# Patient Record
Sex: Male | Born: 1965 | Race: Black or African American | Hispanic: No | Marital: Single | Smoking: Current some day smoker
Health system: Southern US, Community
[De-identification: ages and names within clinical notes are randomized; demographics above are authoritative.]

## PROBLEM LIST (undated history)

## (undated) DIAGNOSIS — E559 Vitamin D deficiency, unspecified: Secondary | ICD-10-CM

## (undated) DIAGNOSIS — F17200 Nicotine dependence, unspecified, uncomplicated: Secondary | ICD-10-CM

## (undated) DIAGNOSIS — S32811A Multiple fractures of pelvis with unstable disruption of pelvic ring, initial encounter for closed fracture: Secondary | ICD-10-CM

## (undated) DIAGNOSIS — S8252XD Displaced fracture of medial malleolus of left tibia, subsequent encounter for closed fracture with routine healing: Secondary | ICD-10-CM

## (undated) HISTORY — PX: ORIF PELVIC FRACTURE: SHX2128

---
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
## 2019-06-06 ENCOUNTER — Inpatient Hospital Stay (HOSPITAL_COMMUNITY)
Admission: EM | Admit: 2019-06-06 | Discharge: 2019-06-12 | DRG: 493 | Disposition: A | Discharge status: Home Health | Payer: No Typology Code available for payment source | Attending: General Surgery | Admitting: General Surgery

## 2019-06-06 ENCOUNTER — Encounter (HOSPITAL_COMMUNITY): Payer: Self-pay

## 2019-06-06 ENCOUNTER — Emergency Department (HOSPITAL_COMMUNITY): Payer: No Typology Code available for payment source

## 2019-06-06 ENCOUNTER — Encounter (HOSPITAL_COMMUNITY): Admission: EM | Disposition: A | Discharge status: Home Health | Payer: Self-pay | Source: Home / Self Care

## 2019-06-06 DIAGNOSIS — S8252XA Displaced fracture of medial malleolus of left tibia, initial encounter for closed fracture: Secondary | ICD-10-CM | POA: Diagnosis present

## 2019-06-06 DIAGNOSIS — T148XXA Other injury of unspecified body region, initial encounter: Secondary | ICD-10-CM

## 2019-06-06 DIAGNOSIS — D62 Acute posthemorrhagic anemia: Secondary | ICD-10-CM | POA: Diagnosis not present

## 2019-06-06 DIAGNOSIS — F17210 Nicotine dependence, cigarettes, uncomplicated: Secondary | ICD-10-CM | POA: Diagnosis present

## 2019-06-06 DIAGNOSIS — S32810A Multiple fractures of pelvis with stable disruption of pelvic ring, initial encounter for closed fracture: Secondary | ICD-10-CM | POA: Diagnosis present

## 2019-06-06 DIAGNOSIS — E860 Dehydration: Secondary | ICD-10-CM | POA: Diagnosis present

## 2019-06-06 DIAGNOSIS — S8252XD Displaced fracture of medial malleolus of left tibia, subsequent encounter for closed fracture with routine healing: Secondary | ICD-10-CM

## 2019-06-06 DIAGNOSIS — Z419 Encounter for procedure for purposes other than remedying health state, unspecified: Secondary | ICD-10-CM

## 2019-06-06 DIAGNOSIS — N5089 Other specified disorders of the male genital organs: Secondary | ICD-10-CM | POA: Diagnosis not present

## 2019-06-06 DIAGNOSIS — Z1159 Encounter for screening for other viral diseases: Secondary | ICD-10-CM | POA: Diagnosis not present

## 2019-06-06 DIAGNOSIS — E559 Vitamin D deficiency, unspecified: Secondary | ICD-10-CM | POA: Diagnosis present

## 2019-06-06 DIAGNOSIS — S32811A Multiple fractures of pelvis with unstable disruption of pelvic ring, initial encounter for closed fracture: Secondary | ICD-10-CM | POA: Diagnosis present

## 2019-06-06 HISTORY — DX: Nicotine dependence, unspecified, uncomplicated: F17.200

## 2019-06-06 HISTORY — DX: Multiple fractures of pelvis with unstable disruption of pelvic ring, initial encounter for closed fracture: S32.811A

## 2019-06-06 LAB — I-STAT CHEM 8, ED
BUN: 18 mg/dL (ref 6–20)
Calcium, Ion: 1.05 mmol/L — ABNORMAL LOW (ref 1.15–1.40)
Chloride: 105 mmol/L (ref 98–111)
Creatinine, Ser: 1.7 mg/dL — ABNORMAL HIGH (ref 0.61–1.24)
Glucose, Bld: 109 mg/dL — ABNORMAL HIGH (ref 70–99)
HCT: 49 % (ref 39.0–52.0)
Hemoglobin: 16.7 g/dL (ref 13.0–17.0)
Potassium: 3.8 mmol/L (ref 3.5–5.1)
Sodium: 140 mmol/L (ref 135–145)
TCO2: 27 mmol/L (ref 22–32)

## 2019-06-06 LAB — COMPREHENSIVE METABOLIC PANEL
ALT: 45 U/L — ABNORMAL HIGH (ref 0–44)
AST: 41 U/L (ref 15–41)
Albumin: 3.8 g/dL (ref 3.5–5.0)
Alkaline Phosphatase: 89 U/L (ref 38–126)
Anion gap: 14 (ref 5–15)
BUN: 15 mg/dL (ref 6–20)
CO2: 22 mmol/L (ref 22–32)
Calcium: 8.9 mg/dL (ref 8.9–10.3)
Chloride: 104 mmol/L (ref 98–111)
Creatinine, Ser: 1.48 mg/dL — ABNORMAL HIGH (ref 0.61–1.24)
GFR calc Af Amer: >60 mL/min (ref >60)
GFR calc non Af Amer: 53 mL/min — ABNORMAL LOW (ref >60)
Glucose, Bld: 112 mg/dL — ABNORMAL HIGH (ref 70–99)
Potassium: 4 mmol/L (ref 3.5–5.1)
Sodium: 140 mmol/L (ref 135–145)
Total Bilirubin: 0.8 mg/dL (ref 0.3–1.2)
Total Protein: 7.1 g/dL (ref 6.5–8.1)

## 2019-06-06 LAB — SAMPLE TO BLOOD BANK

## 2019-06-06 LAB — CBC
HCT: 46.5 % (ref 39.0–52.0)
Hemoglobin: 15.4 g/dL (ref 13.0–17.0)
MCH: 32 pg (ref 26.0–34.0)
MCHC: 33.1 g/dL (ref 30.0–36.0)
MCV: 96.7 fL (ref 80.0–100.0)
Platelets: 206 10*3/uL (ref 150–400)
RBC: 4.81 MIL/uL (ref 4.22–5.81)
RDW: 14.3 % (ref 11.5–15.5)
WBC: 8.8 10*3/uL (ref 4.0–10.5)
nRBC: 0.3 % — ABNORMAL HIGH (ref 0.0–0.2)

## 2019-06-06 LAB — SARS CORONAVIRUS 2 BY RT PCR (HOSPITAL ORDER, PERFORMED IN ~~LOC~~ HOSPITAL LAB): SARS Coronavirus 2: NEGATIVE

## 2019-06-06 LAB — CDS SEROLOGY

## 2019-06-06 LAB — LACTIC ACID, PLASMA: Lactic Acid, Venous: 4.2 mmol/L (ref 0.5–1.9)

## 2019-06-06 LAB — PROTIME-INR
INR: 0.9 (ref 0.8–1.2)
Prothrombin Time: 12.2 seconds (ref 11.4–15.2)

## 2019-06-06 LAB — ETHANOL: Alcohol, Ethyl (B): 83 mg/dL — ABNORMAL HIGH (ref <10)

## 2019-06-06 SURGERY — OPEN REDUCTION INTERNAL FIXATION (ORIF) ANKLE FRACTURE
Anesthesia: General

## 2019-06-06 MED ORDER — HYDROMORPHONE HCL 1 MG/ML IJ SOLN
0.5000 mg | INTRAMUSCULAR | Status: DC | PRN
  Administered 2019-06-07: 0.5 mg via INTRAVENOUS
  Filled 2019-06-06: qty 1

## 2019-06-06 MED ORDER — IOHEXOL 300 MG/ML  SOLN
100.0000 mL | Freq: Once | INTRAMUSCULAR | Status: AC | PRN
  Administered 2019-06-06: 100 mL via INTRAVENOUS

## 2019-06-06 MED ORDER — METOPROLOL TARTRATE 5 MG/5ML IV SOLN: 5.0000 mg | Freq: Four times a day (QID) | INTRAVENOUS | Status: DC | PRN

## 2019-06-06 MED ORDER — OXYCODONE HCL 5 MG PO TABS: 5.0000 mg | ORAL_TABLET | ORAL | Status: DC | PRN

## 2019-06-06 MED ORDER — METHOCARBAMOL 500 MG PO TABS: 500.0000 mg | ORAL_TABLET | Freq: Four times a day (QID) | ORAL | Status: DC | PRN

## 2019-06-06 MED ORDER — HYDRALAZINE HCL 20 MG/ML IJ SOLN: 10.0000 mg | INTRAMUSCULAR | Status: DC | PRN

## 2019-06-06 MED ORDER — ENOXAPARIN SODIUM 40 MG/0.4ML ~~LOC~~ SOLN
40.0000 mg | Freq: Every day | SUBCUTANEOUS | Status: DC
  Administered 2019-06-08 – 2019-06-12 (×5): 40 mg via SUBCUTANEOUS
  Filled 2019-06-06 (×5): qty 0.4

## 2019-06-06 MED ORDER — ONDANSETRON HCL 4 MG/2ML IJ SOLN: 4.0000 mg | Freq: Four times a day (QID) | INTRAMUSCULAR | Status: DC | PRN

## 2019-06-06 MED ORDER — SODIUM CHLORIDE 0.9 % IV SOLN
INTRAVENOUS | Status: DC
  Administered 2019-06-07 (×2): via INTRAVENOUS

## 2019-06-06 MED ORDER — GABAPENTIN 300 MG PO CAPS
300.0000 mg | ORAL_CAPSULE | Freq: Three times a day (TID) | ORAL | Status: DC
  Administered 2019-06-07 – 2019-06-08 (×7): 300 mg via ORAL
  Filled 2019-06-06 (×8): qty 1

## 2019-06-06 MED ORDER — ONDANSETRON 4 MG PO TBDP: 4.0000 mg | ORAL_TABLET | Freq: Four times a day (QID) | ORAL | Status: DC | PRN

## 2019-06-06 MED ORDER — ACETAMINOPHEN 325 MG PO TABS
650.0000 mg | ORAL_TABLET | Freq: Four times a day (QID) | ORAL | Status: DC
  Administered 2019-06-07: 08:00:00 650 mg via ORAL
  Filled 2019-06-06 (×2): qty 2

## 2019-06-06 MED ORDER — DOCUSATE SODIUM 100 MG PO CAPS
100.0000 mg | ORAL_CAPSULE | Freq: Two times a day (BID) | ORAL | Status: DC
  Administered 2019-06-07 – 2019-06-12 (×7): 100 mg via ORAL
  Filled 2019-06-06 (×12): qty 1

## 2019-06-06 MED ORDER — FENTANYL CITRATE (PF) 100 MCG/2ML IJ SOLN
100.0000 ug | Freq: Once | INTRAMUSCULAR | Status: AC
  Administered 2019-06-06: 100 ug via INTRAVENOUS
  Filled 2019-06-06: qty 2
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
## 2019-06-06 NOTE — Progress Notes (Signed)
Orthopedic Tech Progress Note Patient Details:  Joseph Fernandez [birth year] [id number]  Ortho Devices Type of Ortho Device: Post (short leg) splint, Stirrup splint Ortho Device/Splint Location: lle. applied at drs request. Ortho Device/Splint Interventions: Ordered, Application, Adjustment   Post Interventions Patient Tolerated: Well Instructions Provided: Care of device, Adjustment of device   Karolee Stamps 06/06/2019, 10:19 PM

## 2019-06-06 NOTE — ED Notes (Signed)
Dr. Ralene Bathe notified of Lactic Acid 4.2
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
## 2019-06-07 ENCOUNTER — Inpatient Hospital Stay (HOSPITAL_COMMUNITY): Payer: No Typology Code available for payment source

## 2019-06-07 ENCOUNTER — Encounter (HOSPITAL_COMMUNITY): Admission: EM | Disposition: A | Discharge status: Home Health | Payer: Self-pay | Source: Home / Self Care

## 2019-06-07 ENCOUNTER — Encounter (HOSPITAL_COMMUNITY): Payer: Self-pay | Admitting: *Deleted

## 2019-06-07 ENCOUNTER — Inpatient Hospital Stay (HOSPITAL_COMMUNITY): Payer: No Typology Code available for payment source | Admitting: Anesthesiology

## 2019-06-07 ENCOUNTER — Other Ambulatory Visit: Payer: Self-pay

## 2019-06-07 HISTORY — PX: ORIF PELVIC FRACTURE: SHX2128

## 2019-06-07 HISTORY — PX: ORIF ANKLE FRACTURE: SHX5408

## 2019-06-07 LAB — CBC
HCT: 39.4 % (ref 39.0–52.0)
Hemoglobin: 13.5 g/dL (ref 13.0–17.0)
MCH: 32.1 pg (ref 26.0–34.0)
MCHC: 34.3 g/dL (ref 30.0–36.0)
MCV: 93.6 fL (ref 80.0–100.0)
Platelets: 184 10*3/uL (ref 150–400)
RBC: 4.21 MIL/uL — ABNORMAL LOW (ref 4.22–5.81)
RDW: 14.2 % (ref 11.5–15.5)
WBC: 10.5 10*3/uL (ref 4.0–10.5)
nRBC: 0 % (ref 0.0–0.2)

## 2019-06-07 LAB — URINALYSIS, ROUTINE W REFLEX MICROSCOPIC
Bacteria, UA: NONE SEEN
Bilirubin Urine: NEGATIVE
Glucose, UA: NEGATIVE mg/dL
Ketones, ur: 5 mg/dL — AB
Leukocytes,Ua: NEGATIVE
Nitrite: NEGATIVE
Protein, ur: NEGATIVE mg/dL
Specific Gravity, Urine: 1.039 — ABNORMAL HIGH (ref 1.005–1.030)
pH: 5 (ref 5.0–8.0)

## 2019-06-07 LAB — BASIC METABOLIC PANEL
Anion gap: 7 (ref 5–15)
BUN: 12 mg/dL (ref 6–20)
CO2: 26 mmol/L (ref 22–32)
Calcium: 8.5 mg/dL — ABNORMAL LOW (ref 8.9–10.3)
Chloride: 105 mmol/L (ref 98–111)
Creatinine, Ser: 1.12 mg/dL (ref 0.61–1.24)
GFR calc Af Amer: >60 mL/min (ref >60)
GFR calc non Af Amer: >60 mL/min (ref >60)
Glucose, Bld: 123 mg/dL — ABNORMAL HIGH (ref 70–99)
Potassium: 4.1 mmol/L (ref 3.5–5.1)
Sodium: 138 mmol/L (ref 135–145)

## 2019-06-07 LAB — SURGICAL PCR SCREEN
MRSA, PCR: NEGATIVE
Staphylococcus aureus: NEGATIVE

## 2019-06-07 LAB — HIV ANTIBODY (ROUTINE TESTING W REFLEX): HIV Screen 4th Generation wRfx: NONREACTIVE

## 2019-06-07 LAB — LACTIC ACID, PLASMA: Lactic Acid, Venous: 1.2 mmol/L (ref 0.5–1.9)

## 2019-06-07 SURGERY — OPEN REDUCTION INTERNAL FIXATION (ORIF) PELVIC FRACTURE
Anesthesia: General | Site: Pelvis

## 2019-06-07 MED ORDER — MIDAZOLAM HCL 2 MG/2ML IJ SOLN
INTRAMUSCULAR | Status: AC
  Filled 2019-06-07: qty 4

## 2019-06-07 MED ORDER — DEXAMETHASONE SODIUM PHOSPHATE 10 MG/ML IJ SOLN
INTRAMUSCULAR | Status: DC | PRN
  Administered 2019-06-07: 10 mg via INTRAVENOUS

## 2019-06-07 MED ORDER — ONDANSETRON HCL 4 MG/2ML IJ SOLN: 4.0000 mg | Freq: Once | INTRAMUSCULAR | Status: DC | PRN

## 2019-06-07 MED ORDER — ROCURONIUM BROMIDE 10 MG/ML (PF) SYRINGE
PREFILLED_SYRINGE | INTRAVENOUS | Status: AC
  Filled 2019-06-07: qty 20

## 2019-06-07 MED ORDER — LIDOCAINE 2% (20 MG/ML) 5 ML SYRINGE
INTRAMUSCULAR | Status: AC
  Filled 2019-06-07: qty 5

## 2019-06-07 MED ORDER — ONDANSETRON HCL 4 MG/2ML IJ SOLN
INTRAMUSCULAR | Status: DC | PRN
  Administered 2019-06-07: 4 mg via INTRAVENOUS

## 2019-06-07 MED ORDER — OXYCODONE HCL 5 MG PO TABS: 5.0000 mg | ORAL_TABLET | Freq: Once | ORAL | Status: DC | PRN

## 2019-06-07 MED ORDER — CHLORHEXIDINE GLUCONATE 4 % EX LIQD: 60.0000 mL | Freq: Once | CUTANEOUS | Status: DC

## 2019-06-07 MED ORDER — ROCURONIUM BROMIDE 10 MG/ML (PF) SYRINGE
PREFILLED_SYRINGE | INTRAVENOUS | Status: DC | PRN
  Administered 2019-06-07: 50 mg via INTRAVENOUS
  Administered 2019-06-07: 30 mg via INTRAVENOUS
  Administered 2019-06-07 (×2): 20 mg via INTRAVENOUS

## 2019-06-07 MED ORDER — HYDROMORPHONE HCL 1 MG/ML IJ SOLN
0.5000 mg | Freq: Once | INTRAMUSCULAR | Status: AC
  Administered 2019-06-07: 0.5 mg via INTRAVENOUS
  Filled 2019-06-07: qty 1

## 2019-06-07 MED ORDER — OXYCODONE HCL 5 MG PO TABS
5.0000 mg | ORAL_TABLET | ORAL | Status: DC | PRN
  Administered 2019-06-08 – 2019-06-09 (×4): 10 mg via ORAL
  Administered 2019-06-09: 5 mg via ORAL
  Administered 2019-06-09 – 2019-06-11 (×7): 10 mg via ORAL
  Filled 2019-06-07 (×12): qty 2

## 2019-06-07 MED ORDER — 0.9 % SODIUM CHLORIDE (POUR BTL) OPTIME
TOPICAL | Status: DC | PRN
  Administered 2019-06-07: 12:00:00 1000 mL

## 2019-06-07 MED ORDER — LACTATED RINGERS IV SOLN
INTRAVENOUS | Status: DC
  Administered 2019-06-07: 10:00:00 via INTRAVENOUS

## 2019-06-07 MED ORDER — SUGAMMADEX SODIUM 200 MG/2ML IV SOLN
INTRAVENOUS | Status: DC | PRN
  Administered 2019-06-07: 200 mg via INTRAVENOUS

## 2019-06-07 MED ORDER — ACETAMINOPHEN 500 MG PO TABS
1000.0000 mg | ORAL_TABLET | Freq: Three times a day (TID) | ORAL | Status: DC
  Administered 2019-06-07 – 2019-06-12 (×16): 1000 mg via ORAL
  Filled 2019-06-07 (×17): qty 2

## 2019-06-07 MED ORDER — OXYCODONE HCL 5 MG/5ML PO SOLN: 5.0000 mg | Freq: Once | ORAL | Status: DC | PRN

## 2019-06-07 MED ORDER — PHENYLEPHRINE 40 MCG/ML (10ML) SYRINGE FOR IV PUSH (FOR BLOOD PRESSURE SUPPORT)
PREFILLED_SYRINGE | INTRAVENOUS | Status: DC | PRN
  Administered 2019-06-07: 120 ug via INTRAVENOUS
  Administered 2019-06-07: 80 ug via INTRAVENOUS
  Administered 2019-06-07 (×2): 120 ug via INTRAVENOUS
  Administered 2019-06-07: 160 ug via INTRAVENOUS

## 2019-06-07 MED ORDER — TRANEXAMIC ACID-NACL 1000-0.7 MG/100ML-% IV SOLN
1000.0000 mg | INTRAVENOUS | Status: AC
  Administered 2019-06-07: 1000 mg via INTRAVENOUS

## 2019-06-07 MED ORDER — MIDAZOLAM HCL 2 MG/2ML IJ SOLN
INTRAMUSCULAR | Status: DC | PRN
  Administered 2019-06-07: 4 mg via INTRAVENOUS

## 2019-06-07 MED ORDER — FENTANYL CITRATE (PF) 100 MCG/2ML IJ SOLN
INTRAMUSCULAR | Status: DC | PRN
  Administered 2019-06-07: 100 ug via INTRAVENOUS
  Administered 2019-06-07 (×2): 50 ug via INTRAVENOUS
  Administered 2019-06-07: 150 ug via INTRAVENOUS

## 2019-06-07 MED ORDER — ONDANSETRON HCL 4 MG/2ML IJ SOLN
INTRAMUSCULAR | Status: AC
  Filled 2019-06-07: qty 2

## 2019-06-07 MED ORDER — IOHEXOL 300 MG/ML  SOLN
150.0000 mL | Freq: Once | INTRAMUSCULAR | Status: AC | PRN
  Administered 2019-06-07: 08:00:00 50 mL via INTRAVENOUS

## 2019-06-07 MED ORDER — CEFAZOLIN SODIUM-DEXTROSE 2-4 GM/100ML-% IV SOLN
2.0000 g | INTRAVENOUS | Status: AC
  Administered 2019-06-07: 2 g via INTRAVENOUS

## 2019-06-07 MED ORDER — POVIDONE-IODINE 10 % EX SWAB: 2.0000 "application " | Freq: Once | CUTANEOUS | Status: DC

## 2019-06-07 MED ORDER — LACTATED RINGERS IV SOLN
INTRAVENOUS | Status: DC | PRN
  Administered 2019-06-07 (×2): via INTRAVENOUS

## 2019-06-07 MED ORDER — FENTANYL CITRATE (PF) 100 MCG/2ML IJ SOLN: 25.0000 ug | INTRAMUSCULAR | Status: DC | PRN

## 2019-06-07 MED ORDER — PHENYLEPHRINE 40 MCG/ML (10ML) SYRINGE FOR IV PUSH (FOR BLOOD PRESSURE SUPPORT)
PREFILLED_SYRINGE | INTRAVENOUS | Status: AC
  Filled 2019-06-07: qty 20

## 2019-06-07 MED ORDER — ENSURE PRE-SURGERY PO LIQD: 296.0000 mL | Freq: Once | ORAL | Status: DC

## 2019-06-07 MED ORDER — DEXAMETHASONE SODIUM PHOSPHATE 10 MG/ML IJ SOLN
INTRAMUSCULAR | Status: AC
  Filled 2019-06-07: qty 1

## 2019-06-07 MED ORDER — FENTANYL CITRATE (PF) 250 MCG/5ML IJ SOLN
INTRAMUSCULAR | Status: AC
  Filled 2019-06-07: qty 5

## 2019-06-07 MED ORDER — TRANEXAMIC ACID-NACL 1000-0.7 MG/100ML-% IV SOLN
INTRAVENOUS | Status: AC
  Filled 2019-06-07: qty 100

## 2019-06-07 MED ORDER — LIDOCAINE 2% (20 MG/ML) 5 ML SYRINGE
INTRAMUSCULAR | Status: DC | PRN
  Administered 2019-06-07: 60 mg via INTRAVENOUS

## 2019-06-07 MED ORDER — CEFAZOLIN SODIUM-DEXTROSE 2-4 GM/100ML-% IV SOLN
INTRAVENOUS | Status: AC
  Filled 2019-06-07: qty 100

## 2019-06-07 MED ORDER — LIDOCAINE 2% (20 MG/ML) 5 ML SYRINGE
INTRAMUSCULAR | Status: AC
  Filled 2019-06-07: qty 10

## 2019-06-07 MED ORDER — HYDROMORPHONE HCL 1 MG/ML IJ SOLN
0.5000 mg | INTRAMUSCULAR | Status: DC | PRN
  Administered 2019-06-07 – 2019-06-12 (×10): 1 mg via INTRAVENOUS
  Filled 2019-06-07 (×10): qty 1

## 2019-06-07 MED ORDER — PROPOFOL 10 MG/ML IV BOLUS
INTRAVENOUS | Status: DC | PRN
  Administered 2019-06-07: 160 mg via INTRAVENOUS

## 2019-06-07 MED ORDER — SODIUM CHLORIDE 0.9 % IV SOLN
INTRAVENOUS | Status: DC | PRN
  Administered 2019-06-07: 40 ug/min via INTRAVENOUS

## 2019-06-07 SURGICAL SUPPLY — 94 items
BANDAGE ACE 4X5 VEL STRL LF (GAUZE/BANDAGES/DRESSINGS) IMPLANT
BANDAGE ACE 6X5 VEL STRL LF (GAUZE/BANDAGES/DRESSINGS) IMPLANT
BANDAGE ELASTIC 4 VELCRO ST LF (GAUZE/BANDAGES/DRESSINGS) ×4 IMPLANT
BANDAGE ELASTIC 6 VELCRO ST LF (GAUZE/BANDAGES/DRESSINGS) ×4 IMPLANT
BANDAGE ESMARK 6X9 LF (GAUZE/BANDAGES/DRESSINGS) IMPLANT
BIT DRILL 2.9 CANN QC NONSTRL (BIT) ×4 IMPLANT
BIT DRILL 4.9 CANNULATED (BIT) ×2
BIT DRILL AO MATTA 2.5MX230M (BIT) ×2 IMPLANT
BIT DRILL CANN QC 4.9 LRG (BIT) ×2 IMPLANT
BLADE CLIPPER SURG (BLADE) IMPLANT
BNDG ESMARK 6X9 LF (GAUZE/BANDAGES/DRESSINGS)
BNDG GAUZE ELAST 4 BULKY (GAUZE/BANDAGES/DRESSINGS) ×4 IMPLANT
BRUSH SCRUB SURG 4.25 DISP (MISCELLANEOUS) ×8 IMPLANT
COVER MAYO STAND STRL (DRAPES) ×4 IMPLANT
COVER SURGICAL LIGHT HANDLE (MISCELLANEOUS) ×4 IMPLANT
COVER WAND RF STERILE (DRAPES) ×4 IMPLANT
DRAIN CHANNEL 15F RND FF W/TCR (WOUND CARE) IMPLANT
DRAPE C-ARM 42X72 X-RAY (DRAPES) ×4 IMPLANT
DRAPE C-ARMOR (DRAPES) ×4 IMPLANT
DRAPE HALF SHEET 40X57 (DRAPES) ×4 IMPLANT
DRAPE LAPAROTOMY T 102X78X121 (DRAPES) ×4 IMPLANT
DRAPE LAPAROTOMY TRNSV 102X78 (DRAPE) ×4 IMPLANT
DRAPE SURG 17X23 STRL (DRAPES) ×4 IMPLANT
DRAPE U-SHAPE 47X51 STRL (DRAPES) ×4 IMPLANT
DRILL BIT AO MATTA 2.5MX230M (BIT) ×4
DRILL BIT CANNULATED 4.9 (BIT) ×2
DRSG EMULSION OIL 3X3 NADH (GAUZE/BANDAGES/DRESSINGS) ×4 IMPLANT
DRSG MEPILEX BORDER 4X4 (GAUZE/BANDAGES/DRESSINGS) ×4 IMPLANT
DRSG MEPILEX BORDER 4X8 (GAUZE/BANDAGES/DRESSINGS) ×4 IMPLANT
DRSG XEROFORM 1X8 (GAUZE/BANDAGES/DRESSINGS) ×4 IMPLANT
ELECT REM PT RETURN 9FT ADLT (ELECTROSURGICAL) ×4
ELECTRODE REM PT RTRN 9FT ADLT (ELECTROSURGICAL) ×2 IMPLANT
EVACUATOR SILICONE 100CC (DRAIN) IMPLANT
GAUZE SPONGE 4X4 12PLY STRL (GAUZE/BANDAGES/DRESSINGS) IMPLANT
GAUZE SPONGE 4X4 12PLY STRL LF (GAUZE/BANDAGES/DRESSINGS) ×8 IMPLANT
GAUZE XEROFORM 1X8 LF (GAUZE/BANDAGES/DRESSINGS) ×4 IMPLANT
GLOVE BIO SURGEON STRL SZ 6.5 (GLOVE) ×3 IMPLANT
GLOVE BIO SURGEON STRL SZ7.5 (GLOVE) ×8 IMPLANT
GLOVE BIO SURGEON STRL SZ8 (GLOVE) ×8 IMPLANT
GLOVE BIO SURGEONS STRL SZ 6.5 (GLOVE) ×1
GLOVE BIOGEL PI IND STRL 7.5 (GLOVE) ×2 IMPLANT
GLOVE BIOGEL PI IND STRL 8 (GLOVE) ×2 IMPLANT
GLOVE BIOGEL PI INDICATOR 7.5 (GLOVE) ×2
GLOVE BIOGEL PI INDICATOR 8 (GLOVE) ×2
GOWN STRL REUS W/ TWL LRG LVL3 (GOWN DISPOSABLE) ×6 IMPLANT
GOWN STRL REUS W/ TWL XL LVL3 (GOWN DISPOSABLE) ×2 IMPLANT
GOWN STRL REUS W/TWL LRG LVL3 (GOWN DISPOSABLE) ×6
GOWN STRL REUS W/TWL XL LVL3 (GOWN DISPOSABLE) ×2
GUIDEWIRE ASNIS 3.2 NONCAL (WIRE) ×4 IMPLANT
K-WIRE ACE 1.6X6 (WIRE) ×8
KIT BASIN OR (CUSTOM PROCEDURE TRAY) ×4 IMPLANT
KIT TURNOVER KIT B (KITS) ×4 IMPLANT
KWIRE ACE 1.6X6 (WIRE) ×4 IMPLANT
MANIFOLD NEPTUNE II (INSTRUMENTS) ×4 IMPLANT
NEEDLE HYPO 21X1.5 SAFETY (NEEDLE) IMPLANT
NS IRRIG 1000ML POUR BTL (IV SOLUTION) ×4 IMPLANT
PACK GENERAL/GYN (CUSTOM PROCEDURE TRAY) ×4 IMPLANT
PACK ORTHO EXTREMITY (CUSTOM PROCEDURE TRAY) ×4 IMPLANT
PACK TOTAL JOINT (CUSTOM PROCEDURE TRAY) ×4 IMPLANT
PAD ARMBOARD 7.5X6 YLW CONV (MISCELLANEOUS) ×8 IMPLANT
PAD CAST 4YDX4 CTTN HI CHSV (CAST SUPPLIES) ×2 IMPLANT
PADDING CAST COTTON 4X4 STRL (CAST SUPPLIES) ×2
PADDING CAST COTTON 6X4 STRL (CAST SUPPLIES) ×4 IMPLANT
PLATE SYMPHYSIS 92.5M 6H (Plate) ×4 IMPLANT
SCREW ACE CAN 4.0 34M (Screw) ×4 IMPLANT
SCREW ACE CAN 4.0 38M (Screw) ×4 IMPLANT
SCREW CANN 8.0X100 (Screw) ×4 IMPLANT
SCREW CORTEX ST MATTA 3.5X26MM (Screw) ×4 IMPLANT
SCREW CORTEX ST MATTA 3.5X28MM (Screw) ×4 IMPLANT
SCREW CORTEX ST MATTA 3.5X32MM (Screw) ×4 IMPLANT
SCREW CORTEX ST MATTA 3.5X34MM (Screw) ×8 IMPLANT
SCREW CORTEX ST MATTA 3.5X36MM (Screw) ×4 IMPLANT
SCREW CORTEX ST MATTA 3.5X40MM (Screw) ×8 IMPLANT
SCREW CORTEX ST MATTA 4.5X34MM (Screw) ×4 IMPLANT
SPONGE LAP 18X18 RF (DISPOSABLE) ×4 IMPLANT
STAPLER VISISTAT 35W (STAPLE) ×4 IMPLANT
SUCTION FRAZIER HANDLE 10FR (MISCELLANEOUS) ×2
SUCTION TUBE FRAZIER 10FR DISP (MISCELLANEOUS) ×2 IMPLANT
SUT ETHILON 3 0 PS 1 (SUTURE) ×8 IMPLANT
SUT PDS AB 2-0 CT1 27 (SUTURE) IMPLANT
SUT VIC AB 0 CT1 27 (SUTURE) ×4
SUT VIC AB 0 CT1 27XBRD ANBCTR (SUTURE) ×4 IMPLANT
SUT VIC AB 1 CT1 18XCR BRD 8 (SUTURE) ×4 IMPLANT
SUT VIC AB 1 CT1 8-18 (SUTURE) ×4
SUT VIC AB 2-0 CT1 27 (SUTURE) ×4
SUT VIC AB 2-0 CT1 TAPERPNT 27 (SUTURE) ×4 IMPLANT
TOWEL OR 17X24 6PK STRL BLUE (TOWEL DISPOSABLE) ×4 IMPLANT
TOWEL OR 17X26 10 PK STRL BLUE (TOWEL DISPOSABLE) ×8 IMPLANT
TRAY FOLEY MTR SLVR 16FR STAT (SET/KITS/TRAYS/PACK) IMPLANT
TUBE CONNECTING 12'X1/4 (SUCTIONS) ×1
TUBE CONNECTING 12X1/4 (SUCTIONS) ×3 IMPLANT
UNDERPAD 30X30 (UNDERPADS AND DIAPERS) ×4 IMPLANT
WASHER SCREW MATTA SS 13.0X1.5 (Washer) ×4 IMPLANT
WATER STERILE IRR 1000ML POUR (IV SOLUTION) ×16 IMPLANT

## 2019-06-07 NOTE — Anesthesia Procedure Notes (Signed)
Procedure Name: Intubation Date/Time: 06/07/2019 12:20 PM Performed by: Leonor Liv, CRNA Pre-anesthesia Checklist: Patient identified, Emergency Drugs available, Suction available and Patient being monitored Patient Re-evaluated:Patient Re-evaluated prior to induction Oxygen Delivery Method: Circle System Utilized Preoxygenation: Pre-oxygenation with 100% oxygen Induction Type: IV induction Ventilation: Mask ventilation without difficulty Laryngoscope Size: Mac and 4 Grade View: Grade I Tube type: Oral Tube size: 7.5 mm Number of attempts: 1 Airway Equipment and Method: Stylet and Oral airway Placement Confirmation: ETT inserted through vocal cords under direct vision,  positive ETCO2 and breath sounds checked- equal and bilateral Secured at: 23 cm Tube secured with: Tape Dental Injury: Teeth and Oropharynx as per pre-operative assessment

## 2019-06-07 NOTE — ED Notes (Signed)
Attempted report x1. 
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
## 2019-06-07 NOTE — Consult Note (Signed)
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 treatment would allow him to get back to normal function more reliably.   Patient is in agreement with the plan  Discussed risks and benefits associated with surgery including but not limited to nerve injury vessel injury nonunion of his fracture   After in-depth discussion with the patient I do believe that he is going to envelopes in terms of his recovery and being more active socially.  I completely understand his reasoning for this.  He does need to medicate for his mother.  [facility] [facility] and we would like to facilitate that in any way possible.    Anticipate ORIF of his pubic symphysis right SI screw versus trans-sacroiliac screw +/-anterior column screw on the left (his left leg for his L medial malleolus)   - L medial malleolus fracture  Percutaneous fixation   NWB x 6-8 weeks    - Pain management:  Titrate accordingly post op   - ABL anemia/Hemodynamics  Stable   Monitor    Repeat Lactic acid pending   - Medical issues   Nicotine dependence   Discussed the negative effects of nicotine on bone and wound healing   States he has quit in the past and will try again  - DVT/PE prophylaxis:  Would recommend lovenox x 4 weeks   - ID:   Periop abx  - Impediments to fracture healing:  Nicotine dependence   High energy fracture   - Dispo:  OR today to address pelvis and L ankle      Mearl Latin, PA-C [phone] (C) 06/07/2019, 11:34 AM  [facility] [address] [place] [address] [phone] Collier Bullock (F)

## 2019-06-07 NOTE — Plan of Care (Signed)

## 2019-06-07 NOTE — ED Notes (Signed)
Attempted report x 2 

## 2019-06-07 NOTE — Progress Notes (Signed)
PT Cancellation Note  Patient Details Name: Joseph Fernandez MRN: [id number] DOB: [birth year]   Cancelled Treatment:    Reason Eval/Treat Not Completed: Patient at procedure or test/unavailable. Pt in surgery, will check back as time allows and when pt can tolerate.   Leighton Roach, Nectar  Pager [phone] Office [place] 06/07/2019, 12:48 PM

## 2019-06-07 NOTE — Anesthesia Preprocedure Evaluation (Signed)

## 2019-06-07 NOTE — Anesthesia Postprocedure Evaluation (Signed)
Anesthesia Post Note  Patient: Joseph Fernandez  Procedure(s) Performed: OPEN REDUCTION INTERNAL FIXATION (ORIF) PELVIC FRACTURE (N/A Pelvis) OPEN REDUCTION INTERNAL FIXATION (ORIF) ANKLE FRACTURE (Left Ankle)     Patient location during evaluation: PACU Anesthesia Type: General Level of consciousness: awake and alert Pain management: pain level controlled Vital Signs Assessment: post-procedure vital signs reviewed and stable Respiratory status: spontaneous breathing, nonlabored ventilation, respiratory function stable and patient connected to nasal cannula oxygen Cardiovascular status: blood pressure returned to baseline and stable Postop Assessment: no apparent nausea or vomiting Anesthetic complications: no    Last Vitals:  Vitals:   06/07/19 1605 06/07/19 1629  BP: 121/88 (!) 113/95  Pulse: (!) 103 (!) 107  Resp: 18   Temp:  37.4 C  SpO2: 92% 96%    Last Pain:  Vitals:   06/07/19 1636  TempSrc:   PainSc: 9                  Joseph Fernandez
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
## 2019-06-07 NOTE — Transfer of Care (Signed)
Immediate Anesthesia Transfer of Care Note  Patient: Joseph Fernandez  Procedure(s) Performed: OPEN REDUCTION INTERNAL FIXATION (ORIF) PELVIC FRACTURE (N/A Pelvis) OPEN REDUCTION INTERNAL FIXATION (ORIF) ANKLE FRACTURE (Left Ankle)  Patient Location: PACU  Anesthesia Type:General  Level of Consciousness: awake, alert  and oriented  Airway & Oxygen Therapy: Patient Spontanous Breathing and Patient connected to nasal cannula oxygen  Post-op Assessment: Report given to RN, Post -op Vital signs reviewed and stable and Patient moving all extremities  Post vital signs: Reviewed and stable  Last Vitals:  Vitals Value Taken Time  BP 154/91 06/07/2019  3:08 PM  Temp    Pulse 127 06/07/2019  3:02 PM  Resp 29 06/07/2019  3:10 PM  SpO2 100 % 06/07/2019  3:02 PM  Vitals shown include unvalidated device data.  Last Pain:  Vitals:   06/07/19 0900  TempSrc:   PainSc: 0-No pain         Complications: No apparent anesthesia complications

## 2019-06-07 NOTE — Progress Notes (Signed)
OT Cancellation Note  Patient Details Name: Laquentin Loudermilk MRN: [id number] DOB: [birth year]   Cancelled Treatment:    Reason Eval/Treat Not Completed: Patient at procedure or test/ unavailable; to OR today for ORIF; will follow.   Lou Cal, OT [facility] Pager [phone] Office [phone]   Raymondo Band 06/07/2019, 10:01 AM
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
## 2019-06-08 ENCOUNTER — Encounter (HOSPITAL_COMMUNITY): Payer: Self-pay | Admitting: General Practice

## 2019-06-08 ENCOUNTER — Other Ambulatory Visit: Payer: Self-pay

## 2019-06-08 LAB — CBC
HCT: 28.7 % — ABNORMAL LOW (ref 39.0–52.0)
Hemoglobin: 9.7 g/dL — ABNORMAL LOW (ref 13.0–17.0)
MCH: 32.3 pg (ref 26.0–34.0)
MCHC: 33.8 g/dL (ref 30.0–36.0)
MCV: 95.7 fL (ref 80.0–100.0)
Platelets: 165 10*3/uL (ref 150–400)
RBC: 3 MIL/uL — ABNORMAL LOW (ref 4.22–5.81)
RDW: 13.9 % (ref 11.5–15.5)
WBC: 10.9 10*3/uL — ABNORMAL HIGH (ref 4.0–10.5)
nRBC: 0 % (ref 0.0–0.2)

## 2019-06-08 MED ORDER — METHOCARBAMOL 500 MG PO TABS
500.0000 mg | ORAL_TABLET | Freq: Four times a day (QID) | ORAL | Status: DC
  Administered 2019-06-08 – 2019-06-09 (×8): 500 mg via ORAL
  Filled 2019-06-08 (×7): qty 1

## 2019-06-08 NOTE — Discharge Instructions (Addendum)
Orthopaedic Trauma Service Discharge Instructions   General Discharge Instructions  WEIGHT BEARING STATUS: Nonweightbearing Left leg, Weightbearing as tolerated Right leg for transfers ONLY!!!!  RANGE OF MOTION/ACTIVITY: unrestricted range of motion R hip, knee and ankle.  Ok to move left knee and hip as tolerated.  Move toes of left foot to help with swelling   Bone health:  You are vitamin d deficient.  Continue with vitamin d supplementation until further notice.  This is important to promote good bone healing.  You are also on vitamin C to help soft tissue healing as well   Wound Care:  Daily wound care to pelvis and R flank starting once you arrive home.  See instructions below Discharge Wound Care Instructions  Do NOT apply any ointments, solutions or lotions to pin sites or surgical wounds.  These prevent needed drainage and even though solutions like hydrogen peroxide kill bacteria, they also damage cells lining the pin sites that help fight infection.  Applying lotions or ointments can keep the wounds moist and can cause them to breakdown and open up as well. This can increase the risk for infection. When in doubt call the office.  Surgical incisions should be dressed daily.  If any drainage is noted, use one layer of adaptic, then gauze, Kerlix, and an ace wrap.  Once the incision is completely dry and without drainage, it may be left open to air out.  Showering may begin 36-48 hours later.  Cleaning gently with soap and water.  Traumatic wounds should be dressed daily as well.    One layer of adaptic, gauze, Kerlix, then ace wrap.  The adaptic can be discontinued once the draining has ceased    If you have a wet to dry dressing: wet the gauze with saline the squeeze as much saline out so the gauze is moist (not soaking wet), place moistened gauze over wound, then place a dry gauze over the moist one, followed by Kerlix wrap, then ace wrap.     DVT/PE prophylaxis: Lovenox  injection daily x 28 days   Diet: as you were eating previously.  Can use over the counter stool softeners and bowel preparations, such as Miralax, to help with bowel movements.  Narcotics can be constipating.  Be sure to drink plenty of fluids  PAIN MEDICATION USE AND EXPECTATIONS  You have likely been given narcotic medications to help control your pain.  After a traumatic event that results in an fracture (broken bone) with or without surgery, it is ok to use narcotic pain medications to help control one's pain.  We understand that everyone responds to pain differently and each individual patient will be evaluated on a regular basis for the continued need for narcotic medications. Ideally, narcotic medication use should last no more than 6-8 weeks (coinciding with fracture healing).   As a patient it is your responsibility as well to monitor narcotic medication use and report the amount and frequency you use these medications when you come to your office visit.   We would also advise that if you are using narcotic medications, you should take a dose prior to therapy to maximize you participation.  IF YOU ARE ON NARCOTIC MEDICATIONS IT IS NOT PERMISSIBLE TO OPERATE A MOTOR VEHICLE (MOTORCYCLE/CAR/TRUCK/MOPED) OR HEAVY MACHINERY DO NOT MIX NARCOTICS WITH OTHER CNS (CENTRAL NERVOUS SYSTEM) DEPRESSANTS SUCH AS ALCOHOL   STOP SMOKING OR USING NICOTINE PRODUCTS!!!!  As discussed nicotine severely impairs your body's ability to heal surgical and traumatic wounds  but also impairs bone healing.  Wounds and bone heal by forming microscopic blood vessels (angiogenesis) and nicotine is a vasoconstrictor (essentially, shrinks blood vessels).  Therefore, if vasoconstriction occurs to these microscopic blood vessels they essentially disappear and are unable to deliver necessary nutrients to the healing tissue.  This is one modifiable factor that you can do to dramatically increase your chances of healing your  injury.    (This means no smoking, no nicotine gum, patches, etc)  DO NOT USE NONSTEROIDAL ANTI-INFLAMMATORY DRUGS (NSAID'S)  Using products such as Advil (ibuprofen), Aleve (naproxen), Motrin (ibuprofen) for additional pain control during fracture healing can delay and/or prevent the healing response.  If you would like to take over the counter (OTC) medication, Tylenol (acetaminophen) is ok.  However, some narcotic medications that are given for pain control contain acetaminophen as well. Therefore, you should not exceed more than 4000 mg of tylenol in a day if you do not have liver disease.  Also note that there are may OTC medicines, such as cold medicines and allergy medicines that my contain tylenol as well.  If you have any questions about medications and/or interactions please ask your doctor/PA or your pharmacist.      ICE AND ELEVATE INJURED/OPERATIVE EXTREMITY  Using ice and elevating the injured extremity above your heart can help with swelling and pain control.  Icing in a pulsatile fashion, such as 20 minutes on and 20 minutes off, can be followed.    Do not place ice directly on skin. Make sure there is a barrier between to skin and the ice pack.    Using frozen items such as frozen peas works well as the conform nicely to the are that needs to be iced.  USE AN ACE WRAP OR TED HOSE FOR SWELLING CONTROL  In addition to icing and elevation, Ace wraps or TED hose are used to help limit and resolve swelling.  It is recommended to use Ace wraps or TED hose until you are informed to stop.    When using Ace Wraps start the wrapping distally (farthest away from the body) and wrap proximally (closer to the body)   Example: If you had surgery on your leg or thing and you do not have a splint on, start the ace wrap at the toes and work your way up to the thigh        If you had surgery on your upper extremity and do not have a splint on, start the ace wrap at your fingers and work your way up to  the upper arm  IF YOU ARE IN A SPLINT OR CAST DO NOT REMOVE IT FOR ANY REASON   If your splint gets wet for any reason please contact the office immediately. You may shower in your splint or cast as long as you keep it dry.  This can be done by wrapping in a cast cover or garbage back (or similar)  Do Not stick any thing down your splint or cast such as pencils, money, or hangers to try and scratch yourself with.  If you feel itchy take benadryl as prescribed on the bottle for itching  IF YOU ARE IN A CAM BOOT (BLACK BOOT)  You may remove boot periodically. Perform daily dressing changes as noted below.  Wash the liner of the boot regularly and wear a sock when wearing the boot. It is recommended that you sleep in the boot until told otherwise  CALL THE OFFICE WITH ANY QUESTIONS  OR CONCERNS: [phone]        1. PAIN CONTROL:  1. Pain is best controlled by a usual combination of three different methods TOGETHER:  i. Ice/Heat ii. Over the counter pain medication iii. Prescription pain medication 2. You may experience some swelling and bruising in area of wounds. Ice packs or heating pads (30-60 minutes up to 6 times a day) will help. Use ice for the first few days to help decrease swelling and bruising, then switch to heat to help relax tight/sore spots and speed recovery. Some people prefer to use ice alone, heat alone, alternating between ice & heat. Experiment to what works for you. Swelling and bruising can take several weeks to resolve.  3. It is helpful to take an over-the-counter pain medication regularly for the first few weeks. Choose one of the following that works best for you:  i. Naproxen (Aleve, etc) Two 220mg  tabs twice a day ii. Ibuprofen (Advil, etc) Three 200mg  tabs four times a day (every meal & bedtime) iii. Acetaminophen (Tylenol, etc) 500-650mg  four times a day (every meal & bedtime) 4. A prescription for pain medication (such as oxycodone, hydrocodone, etc) may be  given to you upon discharge. Take your pain medication as prescribed.  i. If you are having problems/concerns with the prescription medicine (does not control pain, nausea, vomiting, rash, itching, etc), please call us [phone] to see if we need to switch you to a different pain medicine that will work better for you and/or control your side effect better. ii. If you need a refill on your pain medication, please contact your pharmacy. They will contact our office to request authorization. Prescriptions will not be filled after 5 pm or on week-ends. 1. Avoid getting constipated. When taking pain medications, it is common to experience some constipation. Increasing fluid intake and taking a fiber supplement (such as Metamucil, Citrucel, FiberCon, MiraLax, etc) 1-2 times a day regularly will usually help prevent this problem from occurring. A mild laxative (prune juice, Milk of Magnesia, MiraLax, etc) should be taken according to package directions if there are no bowel movements after 48 hours.  2. Watch out for diarrhea. If you have many loose bowel movements, simplify your diet to bland foods & liquids for a few days. Stop any stool softeners and decrease your fiber supplement. Switching to mild anti-diarrheal medications (Kayopectate, Pepto Bismol) can help. If this worsens or does not improve, please call us. 3. Shower daily but do not bathe until your wounds heal. Cover your wounds with clean gauze and tape after showering.  4. FOLLOW UP  a. If a follow up appointment is needed one will be scheduled for you. If none is needed with our trauma team, please follow up with your primary care provider within 2-3 weeks from discharge. Please call [facility] at [phone] if you have any questions about follow up.   WHEN TO CALL US [phone]:  1. Poor pain control 2. Reactions / problems with new medications (rash/itching, nausea, etc)  3. Fever over 101.5 F (38.5 C) 4. Worsening swelling or  bruising 5. Redness, swelling, foul discharge or increased pain from wounds 6. Productive cough, difficulty breathing or any other concerning symptoms  The clinic staff is available to answer your questions during regular business hours (8:30am-5pm). Please dont hesitate to call and ask to speak to one of our nurses for clinical concerns.  If you have a medical emergency, go to the nearest emergency room or call 911.  A surgeon from [facility] is always on call at the [facility]   [facility], [place]  [address], [address], [place], [place] [address] ?  MAIN: [phone] ? TOLL FREE: [phone] ?  FAX [phone]) [phone]  [email or web address]
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
## 2019-06-09 LAB — CBC
HCT: 23.2 % — ABNORMAL LOW (ref 39.0–52.0)
Hemoglobin: 7.7 g/dL — ABNORMAL LOW (ref 13.0–17.0)
MCH: 31.8 pg (ref 26.0–34.0)
MCHC: 33.2 g/dL (ref 30.0–36.0)
MCV: 95.9 fL (ref 80.0–100.0)
Platelets: 160 10*3/uL (ref 150–400)
RBC: 2.42 MIL/uL — ABNORMAL LOW (ref 4.22–5.81)
RDW: 13.6 % (ref 11.5–15.5)
WBC: 8 10*3/uL (ref 4.0–10.5)
nRBC: 0 % (ref 0.0–0.2)

## 2019-06-09 LAB — BASIC METABOLIC PANEL
Anion gap: 6 (ref 5–15)
BUN: 10 mg/dL (ref 6–20)
CO2: 30 mmol/L (ref 22–32)
Calcium: 8 mg/dL — ABNORMAL LOW (ref 8.9–10.3)
Chloride: 102 mmol/L (ref 98–111)
Creatinine, Ser: 0.89 mg/dL (ref 0.61–1.24)
GFR calc Af Amer: >60 mL/min (ref >60)
GFR calc non Af Amer: >60 mL/min (ref >60)
Glucose, Bld: 115 mg/dL — ABNORMAL HIGH (ref 70–99)
Potassium: 3.6 mmol/L (ref 3.5–5.1)
Sodium: 138 mmol/L (ref 135–145)

## 2019-06-09 MED ORDER — PANTOPRAZOLE SODIUM 40 MG PO TBEC
40.0000 mg | DELAYED_RELEASE_TABLET | Freq: Every day | ORAL | Status: DC
  Administered 2019-06-09 – 2019-06-12 (×4): 40 mg via ORAL
  Filled 2019-06-09 (×3): qty 1

## 2019-06-09 MED ORDER — GABAPENTIN 400 MG PO CAPS
400.0000 mg | ORAL_CAPSULE | Freq: Three times a day (TID) | ORAL | Status: DC
  Administered 2019-06-09 – 2019-06-10 (×6): 400 mg via ORAL
  Filled 2019-06-09 (×5): qty 1

## 2019-06-09 MED ORDER — TAB-A-VITE/IRON PO TABS
1.0000 | ORAL_TABLET | Freq: Every day | ORAL | Status: DC
  Administered 2019-06-09 – 2019-06-12 (×4): 1 via ORAL
  Filled 2019-06-09 (×4): qty 1

## 2019-06-09 MED ORDER — ALUM & MAG HYDROXIDE-SIMETH 200-200-20 MG/5ML PO SUSP: 15.0000 mL | Freq: Four times a day (QID) | ORAL | Status: DC | PRN

## 2019-06-09 MED ORDER — POLYETHYLENE GLYCOL 3350 17 G PO PACK: 17.0000 g | PACK | Freq: Every day | ORAL | Status: DC | PRN

## 2019-06-09 NOTE — Plan of Care (Signed)
  Problem: Education: Goal: Knowledge of General Education information will improve Description Including pain rating scale, medication(s)/side effects and non-pharmacologic comfort measures 06/09/2019 1256 by Chaney Malling, RN Outcome: Progressing 06/09/2019 1237 by Chaney Malling, RN Outcome: Progressing   Problem: Health Behavior/Discharge Planning: Goal: Ability to manage health-related needs will improve 06/09/2019 1256 by Chaney Malling, RN Outcome: Progressing 06/09/2019 1237 by Chaney Malling, RN Outcome: Progressing   Problem: Clinical Measurements: Goal: Ability to maintain clinical measurements within normal limits will improve 06/09/2019 1256 by Chaney Malling, RN Outcome: Progressing 06/09/2019 1237 by Chaney Malling, RN Outcome: Progressing Goal: Will remain free from infection 06/09/2019 1256 by Chaney Malling, RN Outcome: Progressing 06/09/2019 1237 by Chaney Malling, RN Outcome: Progressing Goal: Diagnostic test results will improve 06/09/2019 1256 by Chaney Malling, RN Outcome: Progressing 06/09/2019 1237 by Dineen Kid D, RN Outcome: Progressing Goal: Respiratory complications will improve 06/09/2019 1256 by Chaney Malling, RN Outcome: Progressing 06/09/2019 1237 by Dineen Kid D, RN Outcome: Progressing Goal: Cardiovascular complication will be avoided 06/09/2019 1256 by Chaney Malling, RN Outcome: Progressing 06/09/2019 1237 by Chaney Malling, RN Outcome: Progressing   Problem: Activity: Goal: Risk for activity intolerance will decrease 06/09/2019 1256 by Chaney Malling, RN Outcome: Progressing 06/09/2019 1237 by Chaney Malling, RN Outcome: Progressing   Problem: Nutrition: Goal: Adequate nutrition will be maintained 06/09/2019 1256 by Chaney Malling, RN Outcome: Progressing 06/09/2019 1237 by Dineen Kid D, RN Outcome: Progressing   Problem: Coping: Goal: Level of anxiety will decrease 06/09/2019 1256 by Chaney Malling, RN Outcome:  Progressing 06/09/2019 1237 by Dineen Kid D, RN Outcome: Progressing   Problem: Elimination: Goal: Will not experience complications related to bowel motility 06/09/2019 1256 by Chaney Malling, RN Outcome: Progressing 06/09/2019 1237 by Chaney Malling, RN Outcome: Progressing Goal: Will not experience complications related to urinary retention 06/09/2019 1256 by Chaney Malling, RN Outcome: Progressing 06/09/2019 1237 by Chaney Malling, RN Outcome: Progressing   Problem: Pain Managment: Goal: General experience of comfort will improve 06/09/2019 1256 by Chaney Malling, RN Outcome: Progressing 06/09/2019 1237 by Chaney Malling, RN Outcome: Progressing   Problem: Safety: Goal: Ability to remain free from injury will improve 06/09/2019 1256 by Chaney Malling, RN Outcome: Progressing 06/09/2019 1237 by Dineen Kid D, RN Outcome: Progressing   Problem: Skin Integrity: Goal: Risk for impaired skin integrity will decrease 06/09/2019 1256 by Chaney Malling, RN Outcome: Progressing 06/09/2019 1237 by Chaney Malling, RN Outcome: Progressing

## 2019-06-09 NOTE — Plan of Care (Signed)
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
## 2019-06-09 NOTE — Progress Notes (Signed)
In for hourly rounding with pt. Pt trying to adjust himself in bed. Still having some difficulty getting comfortable. Applied a scrotal support to pt to help with swelling and pain. Pt assisted to a more comfortable position. Continue to monitor
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
## 2019-06-09 NOTE — Progress Notes (Signed)
Orthopedic Tech Progress Note Patient Details:  Joseph Fernandez [birth year] [id number] Applied Over Head Frame with Trapeze for patient Patient ID: Joseph Fernandez, male   DOB: 1966-11-29, 53 y.o.   MRN: [id number]   Janit Pagan 06/09/2019, 10:02 AM
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
## 2019-06-10 LAB — CBC
HCT: 22.8 % — ABNORMAL LOW (ref 39.0–52.0)
Hemoglobin: 7.6 g/dL — ABNORMAL LOW (ref 13.0–17.0)
MCH: 32.2 pg (ref 26.0–34.0)
MCHC: 33.3 g/dL (ref 30.0–36.0)
MCV: 96.6 fL (ref 80.0–100.0)
Platelets: 179 10*3/uL (ref 150–400)
RBC: 2.36 MIL/uL — ABNORMAL LOW (ref 4.22–5.81)
RDW: 13.5 % (ref 11.5–15.5)
WBC: 6.5 10*3/uL (ref 4.0–10.5)
nRBC: 0 % (ref 0.0–0.2)

## 2019-06-10 LAB — VITAMIN D 25 HYDROXY (VIT D DEFICIENCY, FRACTURES): Vit D, 25-Hydroxy: 7.6 ng/mL — ABNORMAL LOW (ref 30.0–100.0)

## 2019-06-10 MED ORDER — METHOCARBAMOL 500 MG PO TABS
750.0000 mg | ORAL_TABLET | Freq: Four times a day (QID) | ORAL | Status: DC
  Administered 2019-06-10 (×4): 750 mg via ORAL
  Filled 2019-06-10 (×4): qty 2
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
## 2019-06-10 NOTE — Plan of Care (Signed)
  Problem: Activity: Goal: Risk for activity intolerance will decrease Outcome: Progressing   Problem: Elimination: Goal: Will not experience complications related to bowel motility Outcome: Progressing   Problem: Skin Integrity: Goal: Risk for impaired skin integrity will decrease Outcome: Progressing   Problem: Pain Managment: Goal: General experience of comfort will improve Outcome: Progressing
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
## 2019-06-11 ENCOUNTER — Inpatient Hospital Stay (HOSPITAL_COMMUNITY): Payer: No Typology Code available for payment source

## 2019-06-11 ENCOUNTER — Encounter (HOSPITAL_COMMUNITY): Payer: Self-pay | Admitting: Orthopedic Surgery

## 2019-06-11 DIAGNOSIS — E559 Vitamin D deficiency, unspecified: Secondary | ICD-10-CM

## 2019-06-11 DIAGNOSIS — S8252XD Displaced fracture of medial malleolus of left tibia, subsequent encounter for closed fracture with routine healing: Secondary | ICD-10-CM

## 2019-06-11 HISTORY — DX: Vitamin D deficiency, unspecified: E55.9

## 2019-06-11 HISTORY — DX: Displaced fracture of medial malleolus of left tibia, subsequent encounter for closed fracture with routine healing: S82.52XD

## 2019-06-11 MED ORDER — OXYCODONE HCL 5 MG PO TABS: 5.0000 mg | ORAL_TABLET | Freq: Four times a day (QID) | ORAL | 0 refills | Status: AC | PRN

## 2019-06-11 MED ORDER — ENOXAPARIN SODIUM 40 MG/0.4ML ~~LOC~~ SOLN: 40.0000 mg | SUBCUTANEOUS | 0 refills | Status: AC

## 2019-06-11 MED ORDER — METHOCARBAMOL 500 MG PO TABS: 1000.0000 mg | ORAL_TABLET | Freq: Four times a day (QID) | ORAL | 0 refills | Status: AC

## 2019-06-11 MED ORDER — PREGABALIN 75 MG PO CAPS: 75.0000 mg | ORAL_CAPSULE | Freq: Every day | ORAL | 0 refills | Status: AC

## 2019-06-11 MED ORDER — POLYETHYLENE GLYCOL 3350 17 G PO PACK: 17.0000 g | PACK | Freq: Every day | ORAL | 0 refills | Status: AC | PRN

## 2019-06-11 MED ORDER — TAB-A-VITE/IRON PO TABS: 1.0000 | ORAL_TABLET | Freq: Every day | ORAL | 1 refills | Status: AC

## 2019-06-11 MED ORDER — DOCUSATE SODIUM 100 MG PO CAPS: 100.0000 mg | ORAL_CAPSULE | Freq: Two times a day (BID) | ORAL | 0 refills | Status: AC

## 2019-06-11 MED ORDER — ASCORBIC ACID 1000 MG PO TABS: 1000.0000 mg | ORAL_TABLET | Freq: Every day | ORAL | 2 refills | Status: AC

## 2019-06-11 MED ORDER — METHOCARBAMOL 500 MG PO TABS
1000.0000 mg | ORAL_TABLET | Freq: Four times a day (QID) | ORAL | Status: DC
  Administered 2019-06-11 – 2019-06-12 (×5): 1000 mg via ORAL
  Filled 2019-06-11 (×5): qty 2

## 2019-06-11 MED ORDER — VITAMIN D-3 125 MCG (5000 UT) PO TABS: 1.0000 | ORAL_TABLET | Freq: Every day | ORAL | 5 refills | Status: AC

## 2019-06-11 MED ORDER — PREGABALIN 25 MG PO CAPS
75.0000 mg | ORAL_CAPSULE | Freq: Every day | ORAL | Status: DC
  Administered 2019-06-11 – 2019-06-12 (×2): 75 mg via ORAL
  Filled 2019-06-11 (×2): qty 3

## 2019-06-11 MED ORDER — VITAMIN D (ERGOCALCIFEROL) 1.25 MG (50000 UNIT) PO CAPS: 50000.0000 [IU] | ORAL_CAPSULE | ORAL | 1 refills | Status: AC

## 2019-06-11 MED ORDER — ACETAMINOPHEN 500 MG PO TABS: 1000.0000 mg | ORAL_TABLET | Freq: Three times a day (TID) | ORAL | 0 refills | Status: AC

## 2019-06-11 MED ORDER — VITAMIN D 25 MCG (1000 UNIT) PO TABS
2000.0000 [IU] | ORAL_TABLET | Freq: Two times a day (BID) | ORAL | Status: DC
  Administered 2019-06-11 – 2019-06-12 (×3): 2000 [IU] via ORAL
  Filled 2019-06-11 (×3): qty 2

## 2019-06-11 MED ORDER — VITAMIN C 500 MG PO TABS
1000.0000 mg | ORAL_TABLET | Freq: Every day | ORAL | Status: DC
  Administered 2019-06-11 – 2019-06-12 (×2): 1000 mg via ORAL
  Filled 2019-06-11 (×2): qty 2

## 2019-06-11 MED FILL — METHOCARBAMOL 500 MG TABLET: 500 | 4 days supply | Qty: 30 | Fill #0

## 2019-06-11 MED FILL — oxyCODONE HCL 5 MG TABS: 5 | 4 days supply | Qty: 15 | Fill #0

## 2019-06-11 MED FILL — ENOXAPARIN 40 MG/0.4 ML SYR: 40 | 28 days supply | Qty: 11 | Fill #0

## 2019-06-11 MED FILL — PREGABALIN 75 MG CAPS: 75 | 30 days supply | Qty: 30 | Fill #0
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
## 2019-06-11 NOTE — Progress Notes (Signed)
Patient suffers from pelvic fractures and L ankle fracture which impairs their ability to perform daily activities like bathing, dressing, feeding, grooming and toileting in the home.  A walker will not resolve issue with performing activities of daily living. A wheelchair will allow patient to safely perform daily activities. Patient can safely propel the wheelchair in the home or has a caregiver who can provide assistance. Length of need 6 months . Accessories: elevating leg rests (ELRs), wheel locks, extensions and anti-tippers.  Brigid Re , [facility] Surgery 06/11/2019, 4:17 PM Pager: [phone]
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
## 2019-06-11 NOTE — TOC Transition Note (Signed)
Transition of Care [facility]) - CM/SW Discharge Note   Patient Details  Name: Silvestre Mines MRN: [id number] Date of Birth: [birth year]  [facility] CM/SW Contact:  Ella Bodo, RN Phone Number: 06/11/2019, 4:39 PM   Clinical Narrative:   Pt medically stable for dc home today with friend to assist.  Pt requesting WC for home; referral to [facility] for DME needs.  Pt is uninsured, but is eligible for medication assistance through [place] letter provided, and dc meds filled through [place].       Barriers to Discharge: Continued Medical Work up   Patient Goals and CMS Choice Patient states their goals for this hospitalization and ongoing recovery are:: to get back home. CMS Medicare.gov Compare Post Acute Care list provided to:: Patient Choice offered to / list presented to : Patient                      Discharge Plan and Services   Discharge Planning Services: CM Consult Post Acute Care Choice: Home Health          DME Arranged: 3-N-1, Walker rolling, Standard WC DME Agency: [employer] Date DME Agency Contacted: 06/10/19 Time DME Agency Contacted: 1222 Representative spoke with at DME Agency: [place]: PT, OT [place] Agency: [place] ([place]) Date [place]: 06/10/19 Time [place]: [id number] Representative spoke with at [place]: Neoma Laming    Readmission Risk Interventions Readmission Risk Prevention Plan 06/11/2019  Medication Screening Complete  Transportation Screening Complete   Reinaldo Raddle, RN, BSN  Trauma/Neuro ICU Case Manager [phone]

## 2019-06-11 NOTE — Plan of Care (Signed)

## 2019-06-12 NOTE — Progress Notes (Signed)
Pt stable complaining of right groin pain and slight numbness over left hand. On exam sensation and strength intact. -patient can be discharged home

## 2019-06-12 NOTE — Plan of Care (Signed)
  Problem: Elimination: Goal: Will not experience complications related to urinary retention Outcome: Progressing   Problem: Pain Managment: Goal: General experience of comfort will improve Outcome: Progressing   
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
## 2020-06-09 IMAGING — CR LEFT ANKLE COMPLETE - 3+ VIEW
3 series · 3 of 3 positions shown · non-contrast
Comparison: 06/07/2019

CLINICAL DATA: Follow-up tibial fracture

EXAM:
LEFT ANKLE COMPLETE - 3+ VIEW

[ankle ap]
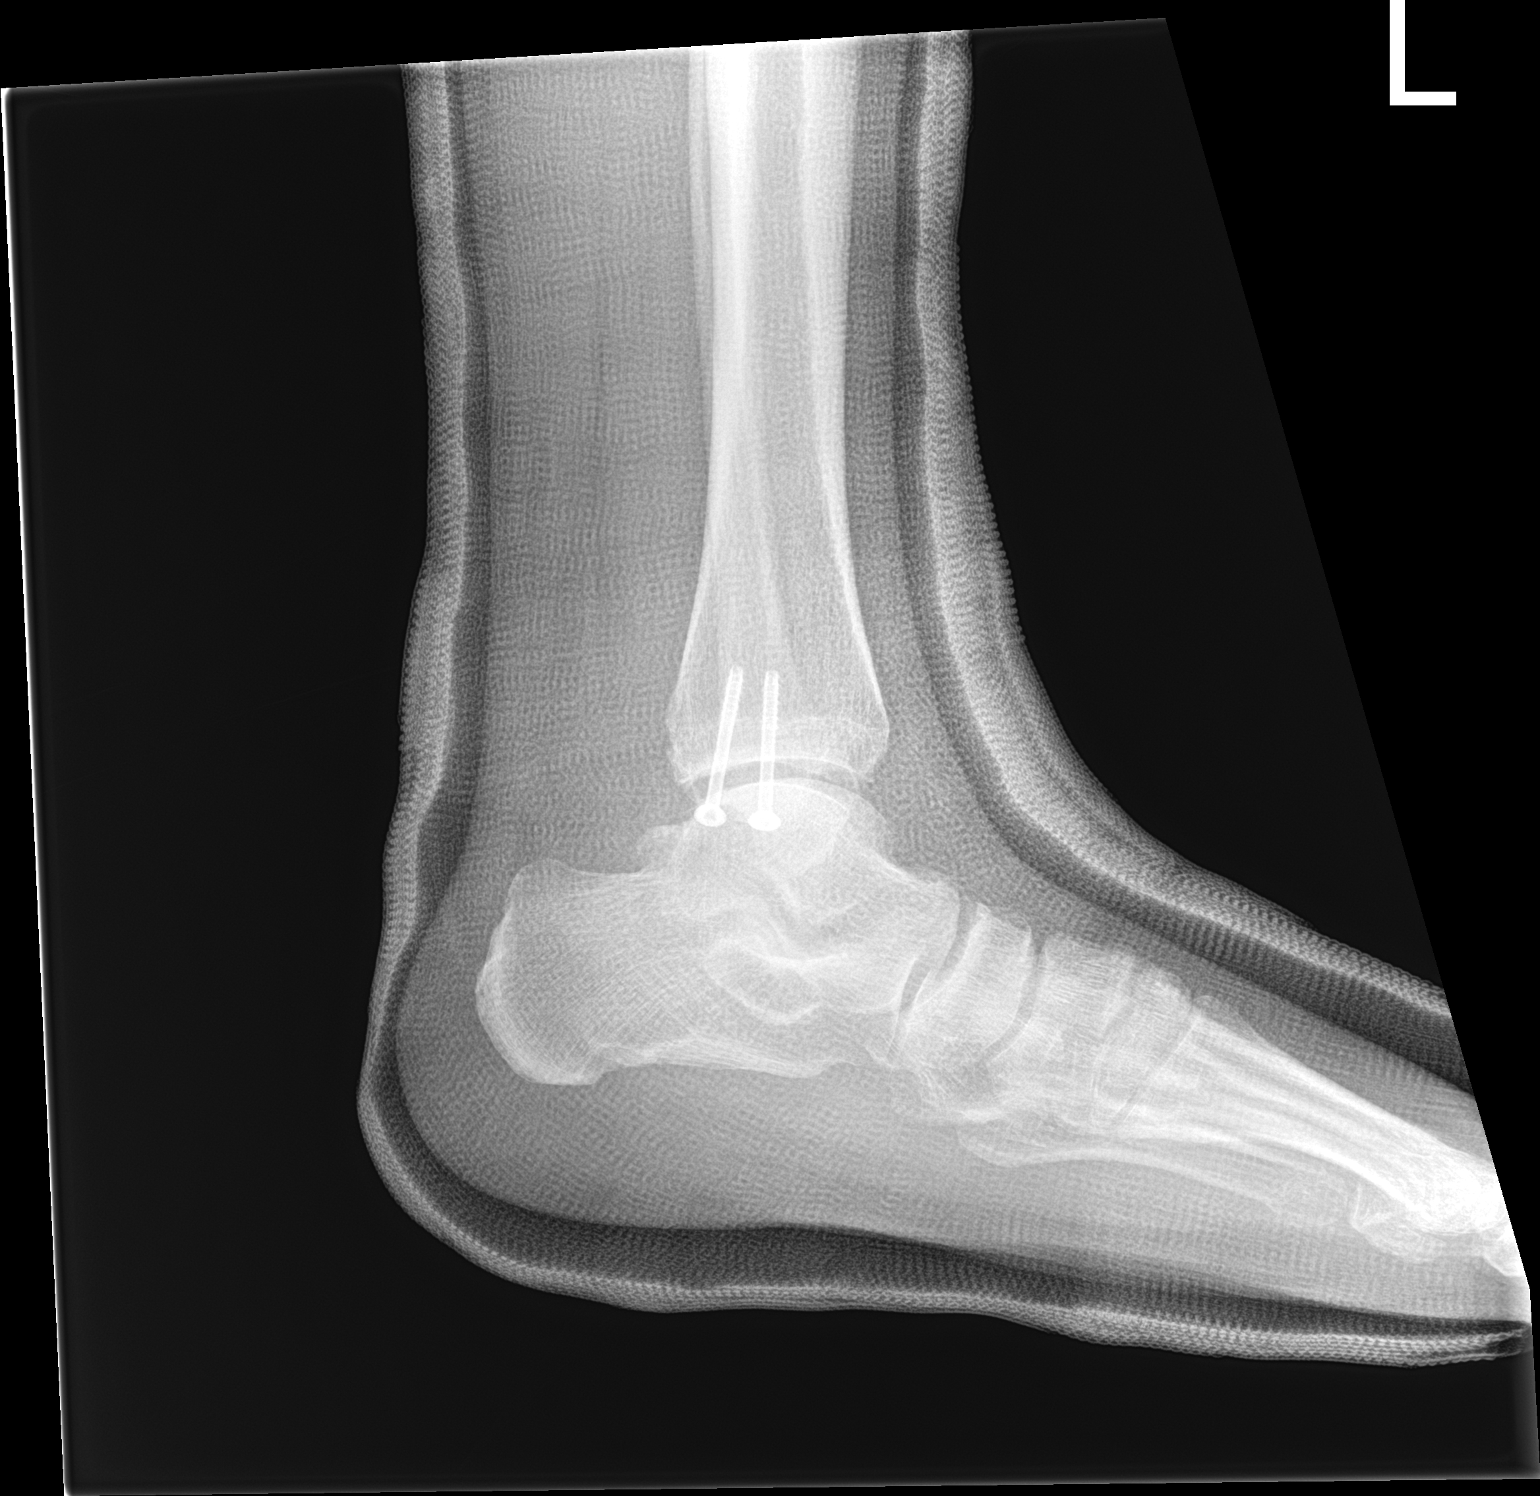

[ankle obl]
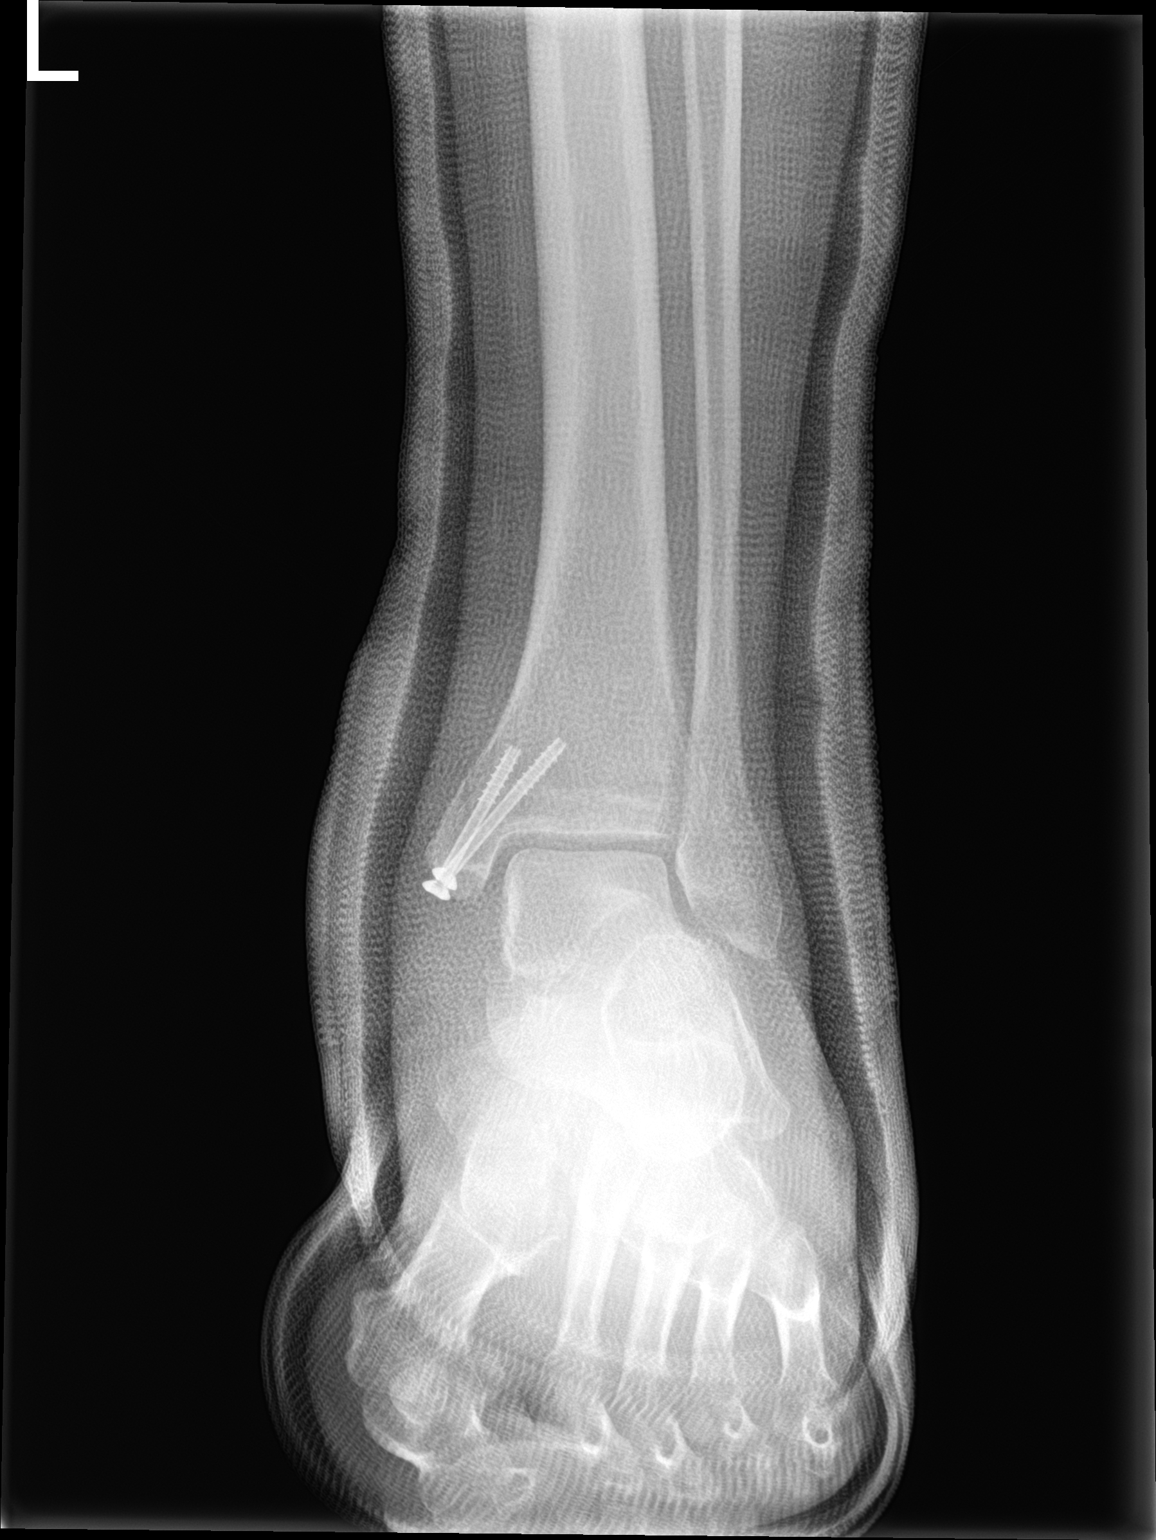

[ankle lat]
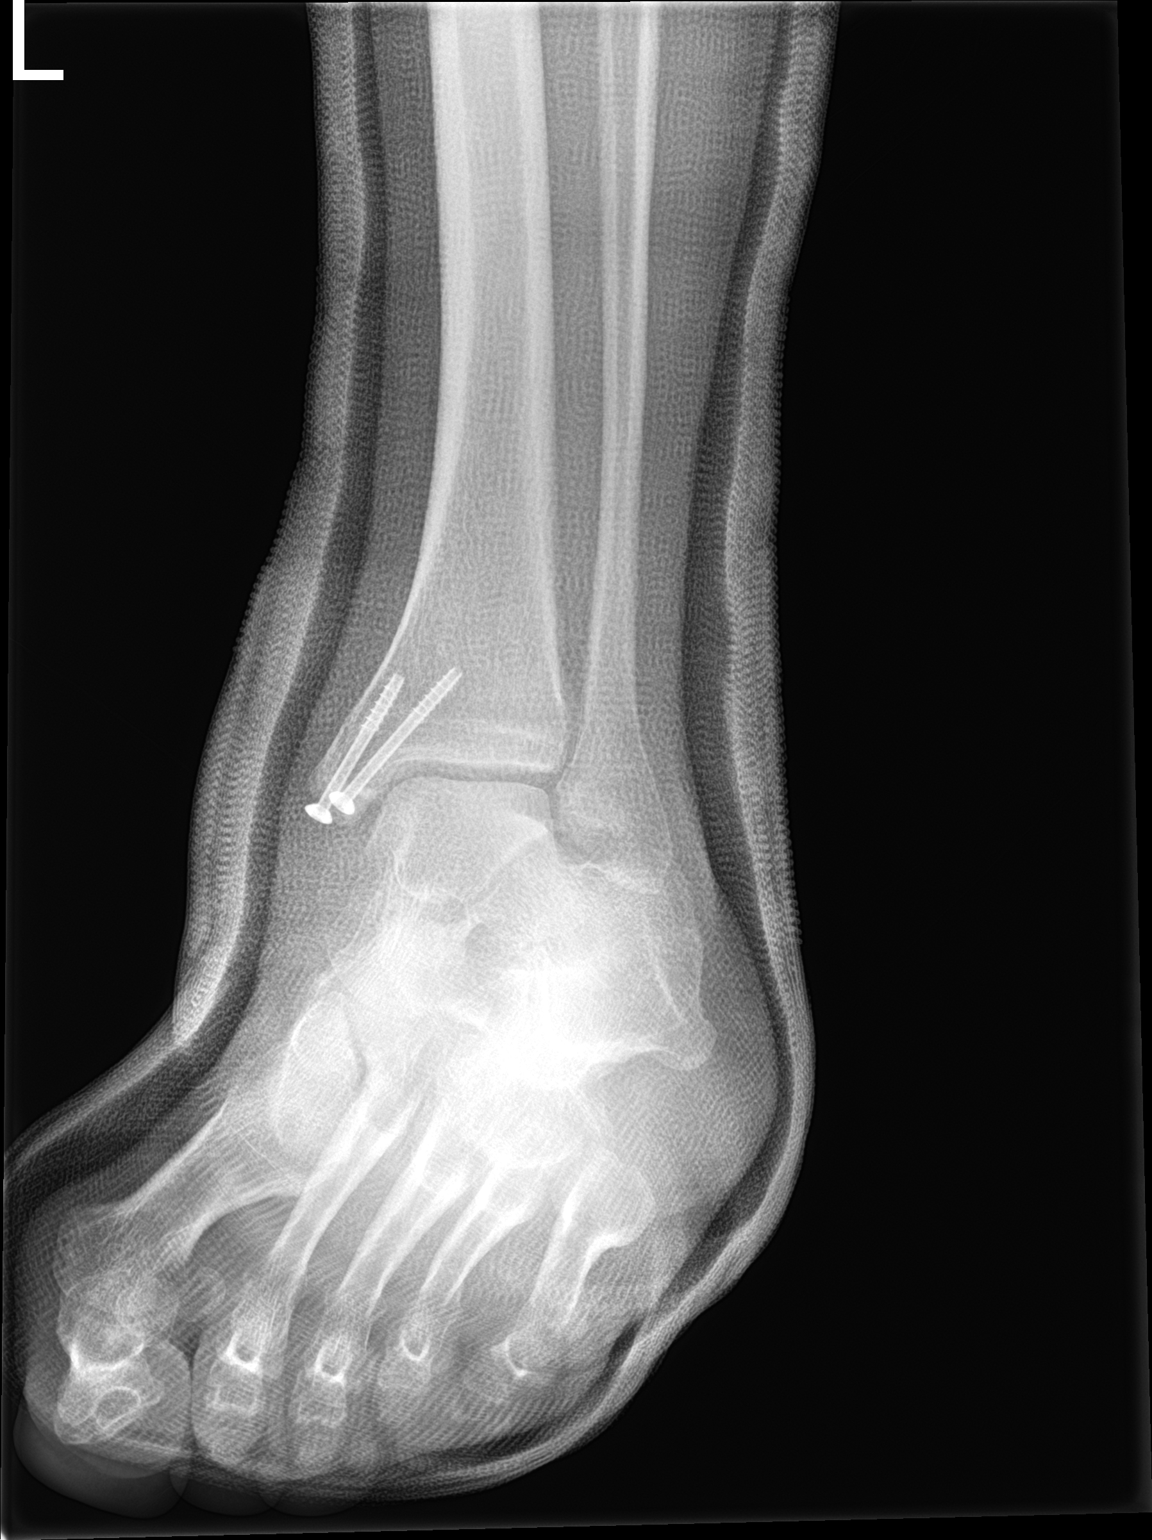

[3 of 3 positions shown; findings below may reference images not displayed]

FINDINGS: Casting material is noted in place with limiting fine bony detail.
Two fixation screws are noted in the medial malleolus. Near anatomic
alignment is noted. No other focal abnormality is noted.
IMPRESSION: Status post surgical fixation of medial malleolar fracture.
